# Patient Record
Sex: Male | Born: 1980 | Race: Black or African American | Hispanic: No | Smoking: Never smoker
Health system: Southern US, Community
[De-identification: ages and names within clinical notes are randomized; demographics above are authoritative.]

## PROBLEM LIST (undated history)

## (undated) DIAGNOSIS — C801 Malignant (primary) neoplasm, unspecified: Secondary | ICD-10-CM

## (undated) HISTORY — PX: COLON SURGERY: SHX602

---

## 2006-04-02 ENCOUNTER — Emergency Department (HOSPITAL_COMMUNITY): Admission: EM | Admit: 2006-04-02 | Discharge: 2006-04-02 | Payer: Self-pay | Admitting: Emergency Medicine

## 2006-05-25 ENCOUNTER — Emergency Department (HOSPITAL_COMMUNITY): Admission: EM | Admit: 2006-05-25 | Discharge: 2006-05-25 | Payer: Self-pay | Admitting: Emergency Medicine

## 2006-06-11 ENCOUNTER — Emergency Department (HOSPITAL_COMMUNITY): Admission: EM | Admit: 2006-06-11 | Discharge: 2006-06-11 | Payer: Self-pay | Admitting: Emergency Medicine

## 2008-08-20 IMAGING — CR DG CHEST 2V
2 series · 2 of 2 positions shown · non-contrast
Comparison: None.

CLINICAL DATA: Cold/cough/fever/
 CHEST - 2 VIEW:

[view not recorded (1 of 2)]
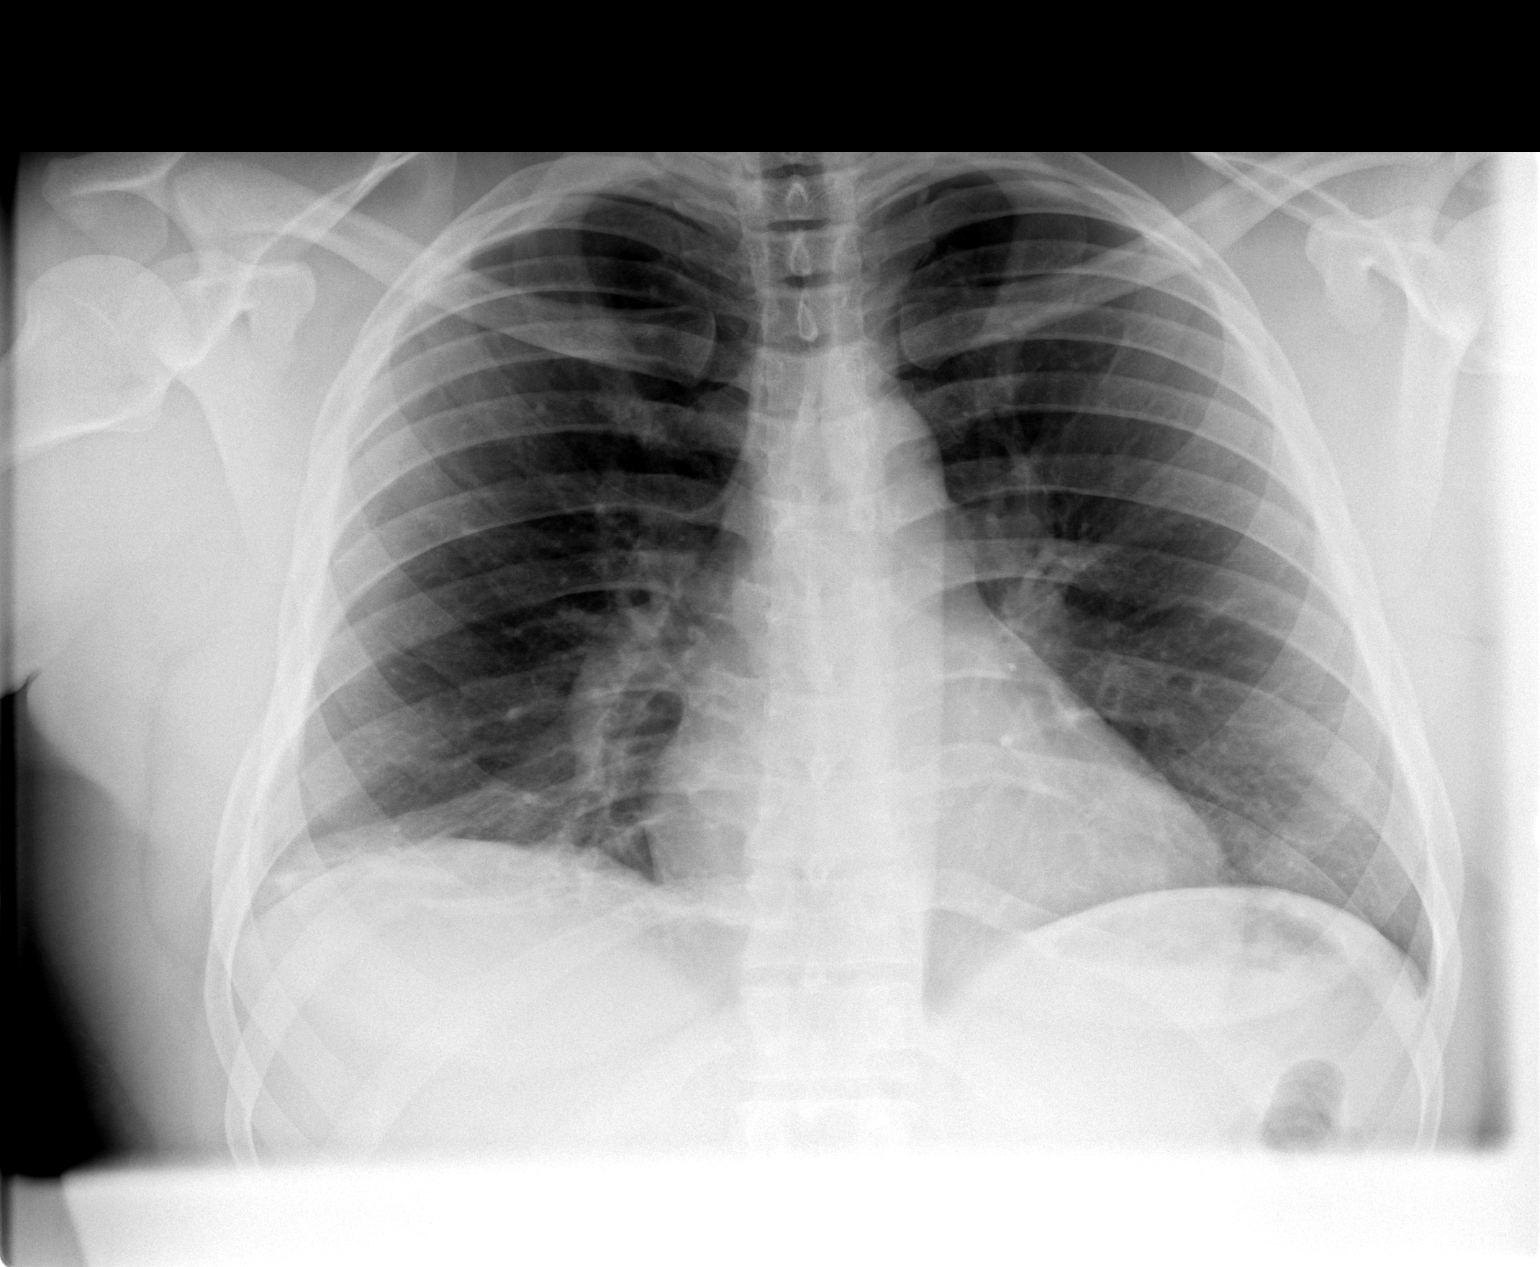

[view not recorded (2 of 2)]
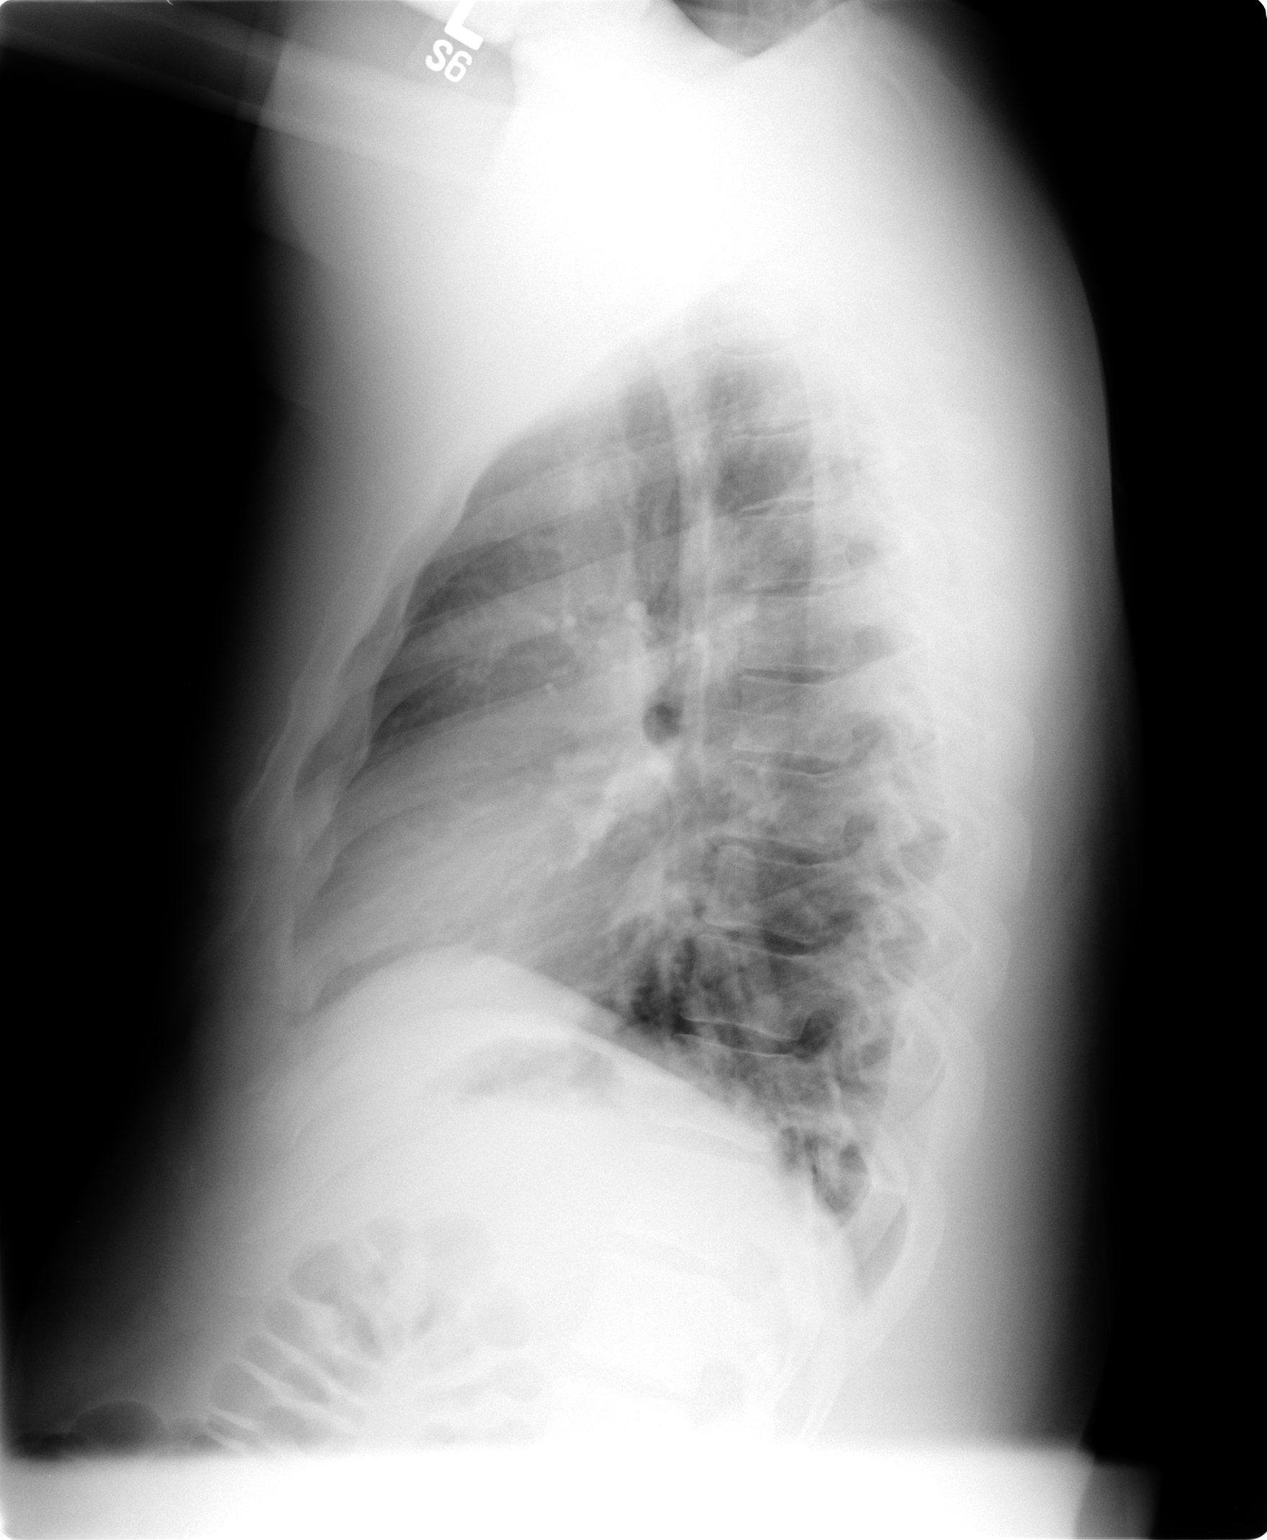

[2 of 2 positions shown; findings below may reference images not displayed]

FINDINGS: Heart size and vascularity within normal limits.  On both views, there is abnormal parenchymal density at the right base.  On the lateral view, there appear to be air bronchograms.  This is likely acute atelectasis or atelectatic pneumonia.  It could be scarring from an old inflammation but we have no prior films for comparison.   This needs careful clinical correlation.
IMPRESSION: Abnormal density at the right base.   Could be scaring from prior inflammation, but suspicious for acute pneumonia.

## 2013-03-10 DIAGNOSIS — C189 Malignant neoplasm of colon, unspecified: Secondary | ICD-10-CM

## 2013-03-10 HISTORY — DX: Malignant neoplasm of colon, unspecified: C18.9

## 2019-05-01 ENCOUNTER — Other Ambulatory Visit: Payer: Self-pay

## 2019-05-01 ENCOUNTER — Emergency Department
Admission: EM | Admit: 2019-05-01 | Discharge: 2019-05-01 | Disposition: A | Discharge status: Home / Self Care | Payer: Self-pay | Attending: Emergency Medicine | Admitting: Emergency Medicine

## 2019-05-01 DIAGNOSIS — Z85038 Personal history of other malignant neoplasm of large intestine: Secondary | ICD-10-CM | POA: Insufficient documentation

## 2019-05-01 DIAGNOSIS — S80822A Blister (nonthermal), left lower leg, initial encounter: Secondary | ICD-10-CM | POA: Insufficient documentation

## 2019-05-01 DIAGNOSIS — Y929 Unspecified place or not applicable: Secondary | ICD-10-CM | POA: Insufficient documentation

## 2019-05-01 DIAGNOSIS — T148XXA Other injury of unspecified body region, initial encounter: Secondary | ICD-10-CM

## 2019-05-01 DIAGNOSIS — Y939 Activity, unspecified: Secondary | ICD-10-CM | POA: Insufficient documentation

## 2019-05-01 DIAGNOSIS — X58XXXA Exposure to other specified factors, initial encounter: Secondary | ICD-10-CM | POA: Insufficient documentation

## 2019-05-01 DIAGNOSIS — Y999 Unspecified external cause status: Secondary | ICD-10-CM | POA: Insufficient documentation

## 2019-05-01 HISTORY — DX: Malignant (primary) neoplasm, unspecified: C80.1

## 2019-05-01 NOTE — ED Provider Notes (Signed)
Emergency Department Provider Note  ____________________________________________  Time seen: Approximately 8:36 PM  I have reviewed the triage vital signs and the nursing notes.   HISTORY  Chief Complaint Abscess   Historian Patient    HPI Daniel Holder is a 39 y.o. male presents to the emergency department with 2 blisters along the left anterior shin after wearing boots for work.  Patient has not noticed edema.  No other alleviating measures have been attempted.   Past Medical History:  Diagnosis Date  . Cancer ([place])   . Colon cancer ([place]) 2015     Immunizations up to date:  Yes.     Past Medical History:  Diagnosis Date  . Cancer ([place])   . Colon cancer ([place]) 2015    There are no problems to display for this patient.   Past Surgical History:  Procedure Laterality Date  . COLON SURGERY      Prior to Admission medications   Not on File    Allergies Patient has no known allergies.  History reviewed. No pertinent family history.  Social History Social History   Tobacco Use  . Smoking status: Never Smoker  . Smokeless tobacco: Never Used  Substance Use Topics  . Alcohol use: Yes  . Drug use: Never     Review of Systems  Constitutional: No fever/chills Eyes:  No discharge ENT: No upper respiratory complaints. Respiratory: no cough. No SOB/ use of accessory muscles to breath Gastrointestinal:   No nausea, no vomiting.  No diarrhea.  No constipation. Musculoskeletal: Negative for musculoskeletal pain. Skin: Patient has blisters along anterior right shin.     ____________________________________________   PHYSICAL EXAM:  VITAL SIGNS: ED Triage Vitals  Enc Vitals Group     BP 05/01/19 1819 (!) 156/93     Pulse Rate 05/01/19 1819 86     Resp 05/01/19 1819 18     Temp 05/01/19 1819 98.8 F (37.1 C)     Temp Source 05/01/19 1819 Oral     SpO2 05/01/19 1819 97 %     Weight 05/01/19 1815 294 lb (133.4 kg)     Height 05/01/19 1815 5'  11" (1.803 m)     Head Circumference --      Peak Flow --      Pain Score 05/01/19 1815 5     Pain Loc --      Pain Edu? --      Excl. in [place]? --      Constitutional: Alert and oriented. Well appearing and in no acute distress. Eyes: Conjunctivae are normal. PERRL. EOMI. Head: Atraumatic. Cardiovascular: Normal rate, regular rhythm. Normal S1 and S2.  Good peripheral circulation. Respiratory: Normal respiratory effort without tachypnea or retractions. Lungs CTAB. Good air entry to the bases with no decreased or absent breath sounds Gastrointestinal: Bowel sounds x 4 quadrants. Soft and nontender to palpation. No guarding or rigidity. No distention. Musculoskeletal: Full range of motion to all extremities. No obvious deformities noted Neurologic:  Normal for age. No gross focal neurologic deficits are appreciated.  Skin: Patient has 2 blisters along right anterior shin.  1 blister is 3 cm x 2 cm and the other blister is 1 cm x 2 cm. Psychiatric: Mood and affect are normal for age. Speech and behavior are normal.   ____________________________________________   LABS (all labs ordered are listed, but only abnormal results are displayed)  Labs Reviewed - No data to display ____________________________________________  EKG   ____________________________________________  RADIOLOGY   No  results found.  ____________________________________________    PROCEDURES  Procedure(s) performed:     Procedures     Medications - No data to display   ____________________________________________   INITIAL IMPRESSION / ASSESSMENT AND PLAN / ED COURSE  Pertinent labs & imaging results that were available during my care of the patient were reviewed by me and considered in my medical decision making (see chart for details).    Assessment and plan Blisters  39 year old male presents to the emergency department with friction blisters along right anterior shin from wearing  boots.  Patient education regarding basic wound care was given.  Tylenol was recommended for discomfort.  He was advised to follow-up with primary care as needed.   ____________________________________________  FINAL CLINICAL IMPRESSION(S) / ED DIAGNOSES  Final diagnoses:  Blister      NEW MEDICATIONS STARTED DURING THIS VISIT:  ED Discharge Orders    None          This chart was dictated using voice recognition software/Dragon. Despite best efforts to proofread, errors can occur which can change the meaning. Any change was purely unintentional.     [name] 05/01/19 2039    Blake Divine, MD 05/03/19 2138

## 2019-05-01 NOTE — ED Notes (Signed)
Pt ambulated to lobby, gait steady.

## 2019-05-01 NOTE — ED Triage Notes (Signed)
Pt comes POV with new raised, pus-filled area on right shin. Pt states that he saw it when he woke up this morning. Pt states that it burns and is puffy.
